# Patient Record
Sex: Male | Born: 1985 | Race: Black or African American | Hispanic: No | Marital: Single | Smoking: Never smoker
Health system: Southern US, Community
[De-identification: ages and names within clinical notes are randomized; demographics above are authoritative.]

---
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
## 2007-07-04 ENCOUNTER — Emergency Department (HOSPITAL_COMMUNITY): Admission: EM | Admit: 2007-07-04 | Discharge: 2007-07-04 | Payer: Self-pay | Admitting: Family Medicine

## 2008-08-27 ENCOUNTER — Emergency Department (HOSPITAL_COMMUNITY): Admission: EM | Admit: 2008-08-27 | Discharge: 2008-08-27 | Payer: Self-pay | Admitting: Family Medicine

## 2009-10-11 ENCOUNTER — Emergency Department (HOSPITAL_COMMUNITY): Admission: EM | Admit: 2009-10-11 | Discharge: 2009-10-11 | Payer: Self-pay | Admitting: Emergency Medicine

## 2010-11-18 ENCOUNTER — Emergency Department (HOSPITAL_COMMUNITY)
Admission: EM | Admit: 2010-11-18 | Discharge: 2010-11-18 | Disposition: A | Discharge status: Home / Self Care | Payer: Self-pay | Attending: Emergency Medicine | Admitting: Emergency Medicine

## 2010-11-18 DIAGNOSIS — L0231 Cutaneous abscess of buttock: Secondary | ICD-10-CM | POA: Insufficient documentation

## 2010-11-18 DIAGNOSIS — L03317 Cellulitis of buttock: Secondary | ICD-10-CM | POA: Insufficient documentation
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
## 2014-10-28 ENCOUNTER — Encounter (HOSPITAL_COMMUNITY): Payer: Self-pay | Admitting: Emergency Medicine

## 2014-10-28 ENCOUNTER — Emergency Department (INDEPENDENT_AMBULATORY_CARE_PROVIDER_SITE_OTHER)
Admission: EM | Admit: 2014-10-28 | Discharge: 2014-10-28 | Disposition: A | Discharge status: Home / Self Care | Payer: Self-pay | Source: Home / Self Care | Attending: Family Medicine | Admitting: Family Medicine

## 2014-10-28 DIAGNOSIS — M62838 Other muscle spasm: Secondary | ICD-10-CM

## 2014-10-28 MED ORDER — DICLOFENAC SODIUM 75 MG PO TBEC: 75.0000 mg | DELAYED_RELEASE_TABLET | Freq: Two times a day (BID) | ORAL | Status: DC

## 2014-10-28 MED ORDER — METHOCARBAMOL 500 MG PO TABS: 500.0000 mg | ORAL_TABLET | Freq: Four times a day (QID) | ORAL | Status: DC | PRN
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
## 2017-04-02 ENCOUNTER — Encounter: Payer: Self-pay | Admitting: Urgent Care

## 2017-04-02 ENCOUNTER — Ambulatory Visit (INDEPENDENT_AMBULATORY_CARE_PROVIDER_SITE_OTHER): Payer: Self-pay | Admitting: Urgent Care

## 2017-04-02 VITALS — BP 124/72 | HR 61 | Temp 98.5°F | Resp 16 | Ht 70.0 in | Wt 173.0 lb

## 2017-04-02 DIAGNOSIS — M542 Cervicalgia: Secondary | ICD-10-CM

## 2017-04-02 DIAGNOSIS — M62838 Other muscle spasm: Secondary | ICD-10-CM

## 2017-04-02 DIAGNOSIS — M79645 Pain in left finger(s): Secondary | ICD-10-CM

## 2017-04-02 DIAGNOSIS — L03012 Cellulitis of left finger: Secondary | ICD-10-CM

## 2017-04-02 MED ORDER — CYCLOBENZAPRINE HCL 5 MG PO TABS: 5.0000 mg | ORAL_TABLET | Freq: Three times a day (TID) | ORAL | 1 refills | Status: DC | PRN

## 2017-04-02 MED ORDER — CEPHALEXIN 500 MG PO CAPS: 500.0000 mg | ORAL_CAPSULE | Freq: Three times a day (TID) | ORAL | 0 refills | Status: AC

## 2017-04-02 NOTE — Progress Notes (Signed)
  MRN: [id number] DOB: 1985-11-11  Subjective:   Christopher Schwartz is a 31 y.o. male presenting for chief complaint of finger problem (left thumb; possible infection underneath the nail bed; painful)  Reports 1 week history of left thumb pain. Has progressed to redness, swelling. Has tried to keep it covered with a band-aid. Denies fever, red streaks on his hand or forearm. Reports intermittent neck pain over trapezius. Denies falls, radicular pain.   Christopher Schwartz is not currently taking any medications.  Also has No Known Allergies.  Christopher Schwartz denies past medical and surgical history.   Objective:   Vitals: BP 124/72   Pulse 61   Temp 98.5 F (36.9 C) (Oral)   Resp 16   Ht  (1.778 m)   Wt 173 lb (78.5 kg)   SpO2 96%   BMI 24.82 kg/m   Physical Exam  Constitutional: Christopher Schwartz is oriented to person, place, and time. Christopher Schwartz appears well-developed and well-nourished.  Cardiovascular: Normal rate.   Pulmonary/Chest: Effort normal.  Musculoskeletal:       Cervical back: Christopher Schwartz exhibits tenderness (over area depicted) and spasm (over area depicted).       Back:       Left hand: Christopher Schwartz exhibits decreased range of motion (left thumb flexion), tenderness (over paronychia) and swelling (left thumb). Christopher Schwartz exhibits normal capillary refill and no laceration. Normal sensation noted. Normal strength noted.       Hands: Neurological: Christopher Schwartz is alert and oriented to person, place, and time.   PROCEDURE NOTE: I&D of Abscess Verbal consent obtained. Local anesthesia with 1cc of 2% lidocaine without epinephrine. Site cleansed with Betadine. Incision of 1cm was made using a 11 blade, discharge of ~1cc of pus and serosanguinous fluid. Cleansed and dressed.  Assessment and Plan :   Paronychia of left thumb - Plan: WOUND CULTURE  Pain of left thumb  Trapezius muscle spasm  Neck pain  Paronychia treated with I&D, wound culture pending, start Keflex. Counseled on posture, hydration and conservative management of  muscles spasms of trapezius.   Wallis Bamberg, PA-C [facility] [phone] 04/02/2017  5:32 PM

## 2017-04-02 NOTE — Patient Instructions (Addendum)
You may take  Tylenol with ibuprofen 400-600mg  every 6 hours for pain and inflammation.     Paronychia Paronychia is an infection of the skin that surrounds a nail. It usually affects the skin around a fingernail, but it may also occur near a toenail. It often causes pain and swelling around the nail. This condition may come on suddenly or develop over a longer period. In some cases, a collection of pus (abscess) can form near or under the nail. Usually, paronychia is not serious and it clears up with treatment. What are the causes? This condition may be caused by bacteria or fungi. It is commonly caused by either Streptococcus or Staphylococcus bacteria. The bacteria or fungi often cause the infection by getting into the affected area through an opening in the skin, such as a cut or a hangnail. What increases the risk? This condition is more likely to develop in:  People who get their hands wet often, such as those who work as Fish farm manager, bartenders, or nurses.  People who bite their fingernails or suck their thumbs.  People who trim their nails too short.  People who have hangnails or injured fingertips.  People who get manicures.  People who have diabetes.  What are the signs or symptoms? Symptoms of this condition include:  Redness and swelling of the skin near the nail.  Tenderness around the nail when you touch the area.  Pus-filled bumps under the cuticle. The cuticle is the skin at the base or sides of the nail.  Fluid or pus under the nail.  Throbbing pain in the area.  How is this diagnosed? This condition is usually diagnosed with a physical exam. In some cases, a sample of pus may be taken from an abscess to be tested in a lab. This can help to determine what type of bacteria or fungi is causing the condition. How is this treated? Treatment for this condition depends on the cause and severity of the condition. If the condition is mild, it may clear up on its  own in a few days. Your health care provider may recommend soaking the affected area in warm water a few times a day. When treatment is needed, the options may include:  Antibiotic medicine, if the condition is caused by a bacterial infection.  Antifungal medicine, if the condition is caused by a fungal infection.  Incision and drainage, if an abscess is present. In this procedure, the health care provider will cut open the abscess so the pus can drain out.  Follow these instructions at home:  Soak the affected area in warm water if directed to do so by your health care provider. You may be told to do this for 20 minutes, 2-3 times a day. Keep the area dry in between soakings.  Take medicines only as directed by your health care provider.  If you were prescribed an antibiotic medicine, finish all of it even if you start to feel better.  Keep the affected area clean.  Do not try to drain a fluid-filled bump yourself.  If you will be washing dishes or performing other tasks that require your hands to get wet, wear rubber gloves. You should also wear gloves if your hands might come in contact with irritating substances, such as cleaners or chemicals.  Follow your health care provider's instructions about: ? Wound care. ? Bandage (dressing) changes and removal. Contact a health care provider if:  Your symptoms get worse or do not improve with treatment.  You have a fever or chills.  You have redness spreading from the affected area.  You have continued or increased fluid, blood, or pus coming from the affected area.  Your finger or knuckle becomes swollen or is difficult to move. This information is not intended to replace advice given to you by your health care provider. Make sure you discuss any questions you have with your health care provider. Document Released: 12/05/2000 Document Revised: 11/17/2015 Document Reviewed: 05/19/2014 Elsevier Interactive Patient Education  2018  [employer].     Muscle Cramps and Spasms Muscle cramps and spasms occur when a muscle or muscles tighten and you have no control over this tightening (involuntary muscle contraction). They are a common problem and can develop in any muscle. The most common place is in the calf muscles of the leg. Muscle cramps and muscle spasms are both involuntary muscle contractions, but there are some differences between the two:  Muscle cramps are painful. They come and go and may last a few seconds to 15 minutes. Muscle cramps are often more forceful and last longer than muscle spasms.  Muscle spasms may or may not be painful. They may also last just a few seconds or much longer.  Certain medical conditions, such as diabetes or Parkinson disease, can make it more likely to develop cramps or spasms. However, cramps or spasms are usually not caused by a serious underlying problem. Common causes include:  Overexertion.  Overuse from repetitive motions, or doing the same thing over and over.  Remaining in a certain position for a long period of time.  Improper preparation, form, or technique while playing a sport or doing an activity.  Dehydration.  Injury.  Side effects of some medicines.  Abnormally low levels of the salts and ions in your blood (electrolytes), especially potassium and calcium. This could happen if you are taking water pills (diuretics) or if you are pregnant.  In many cases, the cause of muscle cramps or spasms is unknown. Follow these instructions at home:  Stay well hydrated. Drink enough fluid to keep your urine clear or pale yellow.  Try massaging, stretching, and relaxing the affected muscle.  If directed, apply heat to tight or tense muscles as often as told by your health care provider. Use the heat source that your health care provider recommends, such as a moist heat pack or a heating pad. ? Place a towel between your skin and the heat source. ? Leave the heat on  for 20-30 minutes. ? Remove the heat if your skin turns bright red. This is especially important if you are unable to feel pain, heat, or cold. You may have a greater risk of getting burned.  If directed, put ice on the affected area. This may help if you are sore or have pain after a cramp or spasm. ? Put ice in a plastic bag. ? Place a towel between your skin and the bag. ? Leavethe ice on for 20 minutes, 2-3 times a day.  Take over-the-counter and prescription medicines only as told by your health care provider.  Pay attention to any changes in your symptoms. Contact a health care provider if:  Your cramps or spasms get more severe or happen more often.  Your cramps or spasms do not improve over time. This information is not intended to replace advice given to you by your health care provider. Make sure you discuss any questions you have with your health care provider. Document Released: 12/01/2001 Document Revised:  07/13/2015 Document Reviewed: 03/15/2015 Elsevier Interactive Patient Education  2018 [employer].     IF you received an x-ray today, you will receive an invoice from [facility]. Please contact [facility] at [phone] with questions or concerns regarding your invoice.   IF you received labwork today, you will receive an invoice from [facility]. Please contact [facility] at [phone] with questions or concerns regarding your invoice.   Our billing staff will not be able to assist you with questions regarding bills from these companies.  You will be contacted with the lab results as soon as they are available. The fastest way to get your results is to activate your My Chart account. Instructions are located on the last page of this paperwork. If you have not heard from [place] regarding the results in 2 weeks, please contact this office.    '

## 2017-04-04 LAB — WOUND CULTURE: Organism ID, Bacteria: NONE SEEN

## 2017-04-09 ENCOUNTER — Ambulatory Visit (INDEPENDENT_AMBULATORY_CARE_PROVIDER_SITE_OTHER): Payer: Self-pay | Admitting: Family Medicine

## 2017-04-09 ENCOUNTER — Encounter: Payer: Self-pay | Admitting: Family Medicine

## 2017-04-09 VITALS — BP 118/70 | HR 72 | Temp 98.5°F | Resp 16 | Ht 70.0 in | Wt 172.6 lb

## 2017-04-09 DIAGNOSIS — L03012 Cellulitis of left finger: Secondary | ICD-10-CM

## 2017-04-09 DIAGNOSIS — M62838 Other muscle spasm: Secondary | ICD-10-CM

## 2017-04-09 NOTE — Patient Instructions (Addendum)
Thumb looks much better. If any return of swelling redness or discharge, return for recheck  I suspect you have some trapezius and paraspinal neck muscle spasm. Work on range of motion and stretches as we discussed, massage as needed, change head positions throughout the day when you're working to minimize some spasm. See other information below. If that is not improving in the next 3-4 weeks, return for possible neck x-rays. Sooner if worse.    Muscle Cramps and Spasms Muscle cramps and spasms occur when a muscle or muscles tighten and you have no control over this tightening (involuntary muscle contraction). They are a common problem and can develop in any muscle. The most common place is in the calf muscles of the leg. Muscle cramps and muscle spasms are both involuntary muscle contractions, but there are some differences between the two:  Muscle cramps are painful. They come and go and may last a few seconds to 15 minutes. Muscle cramps are often more forceful and last longer than muscle spasms.  Muscle spasms may or may not be painful. They may also last just a few seconds or much longer.  Certain medical conditions, such as diabetes or Parkinson disease, can make it more likely to develop cramps or spasms. However, cramps or spasms are usually not caused by a serious underlying problem. Common causes include:  Overexertion.  Overuse from repetitive motions, or doing the same thing over and over.  Remaining in a certain position for a long period of time.  Improper preparation, form, or technique while playing a sport or doing an activity.  Dehydration.  Injury.  Side effects of some medicines.  Abnormally low levels of the salts and ions in your blood (electrolytes), especially potassium and calcium. This could happen if you are taking water pills (diuretics) or if you are pregnant.  In many cases, the cause of muscle cramps or spasms is unknown. Follow these instructions at  home:  Stay well hydrated. Drink enough fluid to keep your urine clear or pale yellow.  Try massaging, stretching, and relaxing the affected muscle.  If directed, apply heat to tight or tense muscles as often as told by your health care provider. Use the heat source that your health care provider recommends, such as a moist heat pack or a heating pad. ? Place a towel between your skin and the heat source. ? Leave the heat on for 20-30 minutes. ? Remove the heat if your skin turns bright red. This is especially important if you are unable to feel pain, heat, or cold. You may have a greater risk of getting burned.  If directed, put ice on the affected area. This may help if you are sore or have pain after a cramp or spasm. ? Put ice in a plastic bag. ? Place a towel between your skin and the bag. ? Leavethe ice on for 20 minutes, 2-3 times a day.  Take over-the-counter and prescription medicines only as told by your health care provider.  Pay attention to any changes in your symptoms. Contact a health care provider if:  Your cramps or spasms get more severe or happen more often.  Your cramps or spasms do not improve over time. This information is not intended to replace advice given to you by your health care provider. Make sure you discuss any questions you have with your health care provider. Document Released: 12/01/2001 Document Revised: 07/13/2015 Document Reviewed: 03/15/2015 Elsevier Interactive Patient Education  2018 [employer].  IF you received an x-ray today, you will receive an invoice from [facility]. Please contact [facility] at [phone] with questions or concerns regarding your invoice.   IF you received labwork today, you will receive an invoice from [place]. Please contact [facility] at [phone] with questions or concerns regarding your invoice.   Our billing staff will not be able to assist you with questions regarding bills from  these companies.  You will be contacted with the lab results as soon as they are available. The fastest way to get your results is to activate your My Chart account. Instructions are located on the last page of this paperwork. If you have not heard from [place] regarding the results in 2 weeks, please contact this office.

## 2017-04-09 NOTE — Progress Notes (Signed)
Subjective:  By signing my name below, I, Essence Howell, attest that this documentation has been prepared under the direction and in the presence of Shade Flood, MD Electronically Signed: Charline Bills, ED Scribe 04/09/2017 at 5:56 PM.   Patient ID: Christopher Schwartz, male    DOB: 22-Sep-1985, 31 y.o.   MRN: [id number]  Chief Complaint  Patient presents with  . Nail Problem    left thumb nail problem follow up  . Follow-up    no issues   HPI Christopher Schwartz is a 31 y.o. male who presents to Primary Care at [facility] for follow-up of paronychia. Seen 10/9 for L thumb pain x 1 week. Redness and swelling. I&D performed with exudate expressed. Started of Keflex. Culture coag negative staph. Also having R sided trapezius pain. Discussed posture, hydration and conservative management.   Pt states that he missed a dose but will finish Keflex tomorrow. Denies return of redness, swelling, pain, drainage from the area.   R Trapezius Pain Pt states that he constantly has trapezius pain that starts in his neck. He describes pain as a burning sensation that is exacerbated with sitting upright for a 3+ hours. Denies weakness in upper extremities.   There are no active problems to display for this patient.  No past medical history on file. No past surgical history on file. No Known Allergies Prior to Admission medications   Medication Sig Start Date End Date Taking? Authorizing Provider  cephALEXin (KEFLEX) 500 MG capsule Take 1 capsule (500 mg total) by mouth 3 (three) times daily. 04/02/17   Wallis Bamberg, PA-C  cyclobenzaprine (FLEXERIL) 5 MG tablet Take 1 tablet (5 mg total) by mouth 3 (three) times daily as needed. 04/02/17   Wallis Bamberg, PA-C   Social History   Social History  . Marital status: Single    Spouse name: N/A  . Number of children: N/A  . Years of education: N/A   Occupational History  . Not on file.   Social History Main Topics  . Smoking status: Never Smoker  .  Smokeless tobacco: Never Used  . Alcohol use Yes  . Drug use: Unknown  . Sexual activity: Not on file   Other Topics Concern  . Not on file   Social History Narrative  . No narrative on file   Review of Systems  Musculoskeletal: Positive for myalgias.  Skin: Positive for wound. Negative for color change.  Neurological: Negative for weakness.      Objective:   Physical Exam  Constitutional: He is oriented to person, place, and time. He appears well-developed and well-nourished. No distress.  HENT:  Head: Normocephalic and atraumatic.  Eyes: Conjunctivae and EOM are normal.  Neck: Neck supple. No tracheal deviation present.  Cardiovascular: Normal rate.   Pulmonary/Chest: Effort normal. No respiratory distress.  Musculoskeletal: Normal range of motion.  Slight trapezius spasms R greater than L. No midline bony tenderness. Equal scapula thoracic motion. No winging. Strength equal in biceps, triceps, deltoids.  Neurological: He is alert and oriented to person, place, and time.  Reflex Scores:      Tricep reflexes are 2+ on the right side and 2+ on the left side.      Bicep reflexes are 2+ on the right side and 2+ on the left side.      Brachioradialis reflexes are 2+ on the right side and 2+ on the left side. Skin: Skin is warm and dry.  Healing wound on L ulnar nailfold. No  appreciable swelling or exudate. No fluctuance. Minimal decrease IP flexion at the thumb compared to R but pain free. Intact flexion and extension against resistance.   Psychiatric: He has a normal mood and affect. His behavior is normal.  Nursing note and vitals reviewed.  Vitals:   04/09/17 1656  BP: 118/70  Pulse: 72  Resp: 16  Temp: 98.5 F (36.9 C)  TempSrc: Oral  SpO2: 99%  Weight: 172 lb 9.6 oz (78.3 kg)  Height:  (1.778 m)      Assessment & Plan:    Christopher Schwartz is a 31 y.o. male Paronychia of left thumb  - improved. rtc precautions if recurrence or increased pain/swelling.    Trapezius muscle spasm  - Suspect a component of trapezius spasm, paraspinal neck spasm. Discussed range of motion, stretches throughout the day, frequent position changes. If not improving in 3-4 weeks, consider cervical source with evaluation by x-ray. Sooner if worse  No orders of the defined types were placed in this encounter.  Patient Instructions    Thumb looks much better. If any return of swelling redness or discharge, return for recheck  I suspect you have some trapezius and paraspinal neck muscle spasm. Work on range of motion and stretches as we discussed, massage as needed, change head positions throughout the day when you're working to minimize some spasm. See other information below. If that is not improving in the next 3-4 weeks, return for possible neck x-rays. Sooner if worse.    Muscle Cramps and Spasms Muscle cramps and spasms occur when a muscle or muscles tighten and you have no control over this tightening (involuntary muscle contraction). They are a common problem and can develop in any muscle. The most common place is in the calf muscles of the leg. Muscle cramps and muscle spasms are both involuntary muscle contractions, but there are some differences between the two:  Muscle cramps are painful. They come and go and may last a few seconds to 15 minutes. Muscle cramps are often more forceful and last longer than muscle spasms.  Muscle spasms may or may not be painful. They may also last just a few seconds or much longer.  Certain medical conditions, such as diabetes or Parkinson disease, can make it more likely to develop cramps or spasms. However, cramps or spasms are usually not caused by a serious underlying problem. Common causes include:  Overexertion.  Overuse from repetitive motions, or doing the same thing over and over.  Remaining in a certain position for a long period of time.  Improper preparation, form, or technique while playing a sport or doing  an activity.  Dehydration.  Injury.  Side effects of some medicines.  Abnormally low levels of the salts and ions in your blood (electrolytes), especially potassium and calcium. This could happen if you are taking water pills (diuretics) or if you are pregnant.  In many cases, the cause of muscle cramps or spasms is unknown. Follow these instructions at home:  Stay well hydrated. Drink enough fluid to keep your urine clear or pale yellow.  Try massaging, stretching, and relaxing the affected muscle.  If directed, apply heat to tight or tense muscles as often as told by your health care provider. Use the heat source that your health care provider recommends, such as a moist heat pack or a heating pad. ? Place a towel between your skin and the heat source. ? Leave the heat on for 20-30 minutes. ? Remove the heat  if your skin turns bright red. This is especially important if you are unable to feel pain, heat, or cold. You may have a greater risk of getting burned.  If directed, put ice on the affected area. This may help if you are sore or have pain after a cramp or spasm. ? Put ice in a plastic bag. ? Place a towel between your skin and the bag. ? Leavethe ice on for 20 minutes, 2-3 times a day.  Take over-the-counter and prescription medicines only as told by your health care provider.  Pay attention to any changes in your symptoms. Contact a health care provider if:  Your cramps or spasms get more severe or happen more often.  Your cramps or spasms do not improve over time. This information is not intended to replace advice given to you by your health care provider. Make sure you discuss any questions you have with your health care provider. Document Released: 12/01/2001 Document Revised: 07/13/2015 Document Reviewed: 03/15/2015 Elsevier Interactive Patient Education  2018 [employer].    IF you received an x-ray today, you will receive an invoice from [facility]  Radiology. Please contact [facility] at [phone] with questions or concerns regarding your invoice.   IF you received labwork today, you will receive an invoice from [place]. Please contact [facility] at [phone] with questions or concerns regarding your invoice.   Our billing staff will not be able to assist you with questions regarding bills from these companies.  You will be contacted with the lab results as soon as they are available. The fastest way to get your results is to activate your My Chart account. Instructions are located on the last page of this paperwork. If you have not heard from [place] regarding the results in 2 weeks, please contact this office.       I personally performed the services described in this documentation, which was scribed in my presence. The recorded information has been reviewed and considered for accuracy and completeness, addended by me as needed, and agree with information above.  Signed,   Meredith Staggers, MD [facility] at [facility].  04/13/17 10:00 AM

## 2020-07-06 ENCOUNTER — Encounter: Payer: Self-pay | Admitting: Emergency Medicine

## 2020-07-06 ENCOUNTER — Ambulatory Visit
Admission: EM | Admit: 2020-07-06 | Discharge: 2020-07-06 | Disposition: A | Discharge status: Home / Self Care | Payer: Self-pay | Attending: Family Medicine | Admitting: Family Medicine

## 2020-07-06 ENCOUNTER — Other Ambulatory Visit: Payer: Self-pay

## 2020-07-06 ENCOUNTER — Ambulatory Visit (INDEPENDENT_AMBULATORY_CARE_PROVIDER_SITE_OTHER): Payer: Self-pay

## 2020-07-06 DIAGNOSIS — M79662 Pain in left lower leg: Secondary | ICD-10-CM

## 2020-07-06 DIAGNOSIS — B351 Tinea unguium: Secondary | ICD-10-CM

## 2020-07-06 DIAGNOSIS — R2241 Localized swelling, mass and lump, right lower limb: Secondary | ICD-10-CM

## 2020-07-06 MED ORDER — MELOXICAM 10 MG PO CAPS: 10.0000 mg | ORAL_CAPSULE | Freq: Every day | ORAL | 0 refills | Status: AC

## 2020-07-06 MED ORDER — CLOTRIMAZOLE 1 % EX CREA: TOPICAL_CREAM | CUTANEOUS | 0 refills | Status: AC

## 2020-07-06 NOTE — ED Provider Notes (Signed)
[facility]   [id number] 07/06/20 Arrival Time: 1754  ZJ:IRCVE PAIN  SUBJECTIVE: History from: patient. Christopher Schwartz is a 35 y.o. male complains of right shin pain that began couple weeks ago.  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  Reports that he hits his leg often.  Does not recall specific injury.  Localizes pain to the right anterior lower leg.  Also complains of fungus to toenails on the left foot.  Has been using peroxide to help clean his feet.  This is not helped. Symptoms are made worse with activity.  Denies similar symptoms in the past.  Denies fever, chills, erythema, ecchymosis, effusion, weakness, numbness and tingling, saddle paresthesias, loss of bowel or bladder function.      ROS: As per HPI.  All other pertinent ROS negative.     History reviewed. No pertinent past medical history. History reviewed. No pertinent surgical history. No Known Allergies No current facility-administered medications on file prior to encounter.   Current Outpatient Medications on File Prior to Encounter  Medication Sig Dispense Refill   cephALEXin (KEFLEX) 500 MG capsule Take 1 capsule (500 mg total) by mouth 3 (three) times daily. 21 capsule 0   Social History   Socioeconomic History   Marital status: Not on file    Spouse name: Not on file   Number of children: Not on file   Years of education: Not on file   Highest education level: Not on file  Occupational History   Not on file  Tobacco Use   Smoking status: Never Smoker   Smokeless tobacco: Never Used  Substance and Sexual Activity   Alcohol use: Yes   Drug use: Yes    Types: Marijuana    Comment: daily   Sexual activity: Not on file  Other Topics Concern   Not on file  Social History Narrative   ** Merged History Encounter **       Social Determinants of Health   Financial Resource Strain: Not on file  Food Insecurity: Not on file  Transportation Needs: Not on file   Physical Activity: Not on file  Stress: Not on file  Social Connections: Not on file  Intimate Partner Violence: Not on file   Family History  Problem Relation Age of Onset   Diabetes Sister     OBJECTIVE:  Vitals:   07/06/20 1819 07/06/20 1821  BP:  109/63  Pulse:  67  Resp:  18  Temp:  98.1 F (36.7 C)  TempSrc:  Oral  SpO2:  94%  Weight: 175 lb (79.4 kg)   Height: 5\' 10"  (1.778 m)     General appearance: ALERT; in no acute distress.  Head: NCAT Lungs: Normal respiratory effort CV:  pulses 2+ bilaterally. Cap refill < 2 seconds Musculoskeletal:  Inspection: Skin warm, dry, clear and intact without obvious erythema, effusion, or ecchymosis.  Palpation: Nontender to palpation ROM: FROM active and passive Skin: warm and dry, tips of the toenails on the left foot are dark yellow and green in color Neurologic: Ambulates without difficulty; Sensation intact about the upper/ lower extremities Psychological: alert and cooperative; normal mood and affect  DIAGNOSTIC STUDIES:  No results found.   ASSESSMENT & PLAN:  1. Pain in left lower leg   2. Nail fungus     Meds ordered this encounter  Medications   clotrimazole (LOTRIMIN) 1 % cream    Sig: Apply to affected area 2 times daily    Dispense:  15 g    Refill:  0    Order Specific Question:   Supervising Provider    Answer:   Merrilee Jansky [id number]   Meloxicam 10 MG CAPS    Sig: Take 10 mg by mouth daily.    Dispense:  30 capsule    Refill:  0    Order Specific Question:   Supervising Provider    Answer:   Merrilee Jansky [[id number]]    X-ray negative today I have sent in meloxicam for you to take daily for pain and inflammation Do not take ibuprofen with this medication I have sent in Lotrimin apply to the affected areas on your feet twice a day Continue conservative management of rest, ice, and gentle stretches Take Tylenol as needed for pain relief   Follow up with if symptoms persist Return  or go to the ER if you have any new or worsening symptoms (fever, chills, chest pain, abdominal pain, changes in bowel or bladder habits, pain radiating into lower legs)  Reviewed expectations re: course of current medical issues. Questions answered. Outlined signs and symptoms indicating need for more acute intervention. Patient verbalized understanding. After Visit Summary given.       Moshe Cipro, NP 07/08/20 1327

## 2020-07-06 NOTE — Discharge Instructions (Addendum)
Xrays are negative today  Take meloxicam once daily for the pain in your right leg.  Do not take ibuprofen with this medication  Use Lotrimin to the bottom of your feet and to the toenails on the left foot.  Follow-up with orthopedics in 2 weeks if you are still experiencing leg pain  Follow up with this office or with primary care if symptoms are persisting.  Follow up in the ER for high fever, trouble swallowing, trouble breathing, other concerning symptoms.

## 2020-07-06 NOTE — ED Triage Notes (Signed)
RT shin pain on and off for few months. Denies any injury.  LT great toe has greenish color on nail.

## 2020-07-07 ENCOUNTER — Ambulatory Visit: Payer: Self-pay

## 2020-07-07 ENCOUNTER — Encounter: Payer: Self-pay | Admitting: Emergency Medicine

## 2022-01-08 IMAGING — DX DG TIBIA/FIBULA 2V*R*
2 series · 2 of 2 positions shown · non-contrast
Comparison: None.

CLINICAL DATA: Swelling, injury

EXAM:
RIGHT TIBIA AND FIBULA - 2 VIEW

[tib/fib ap]
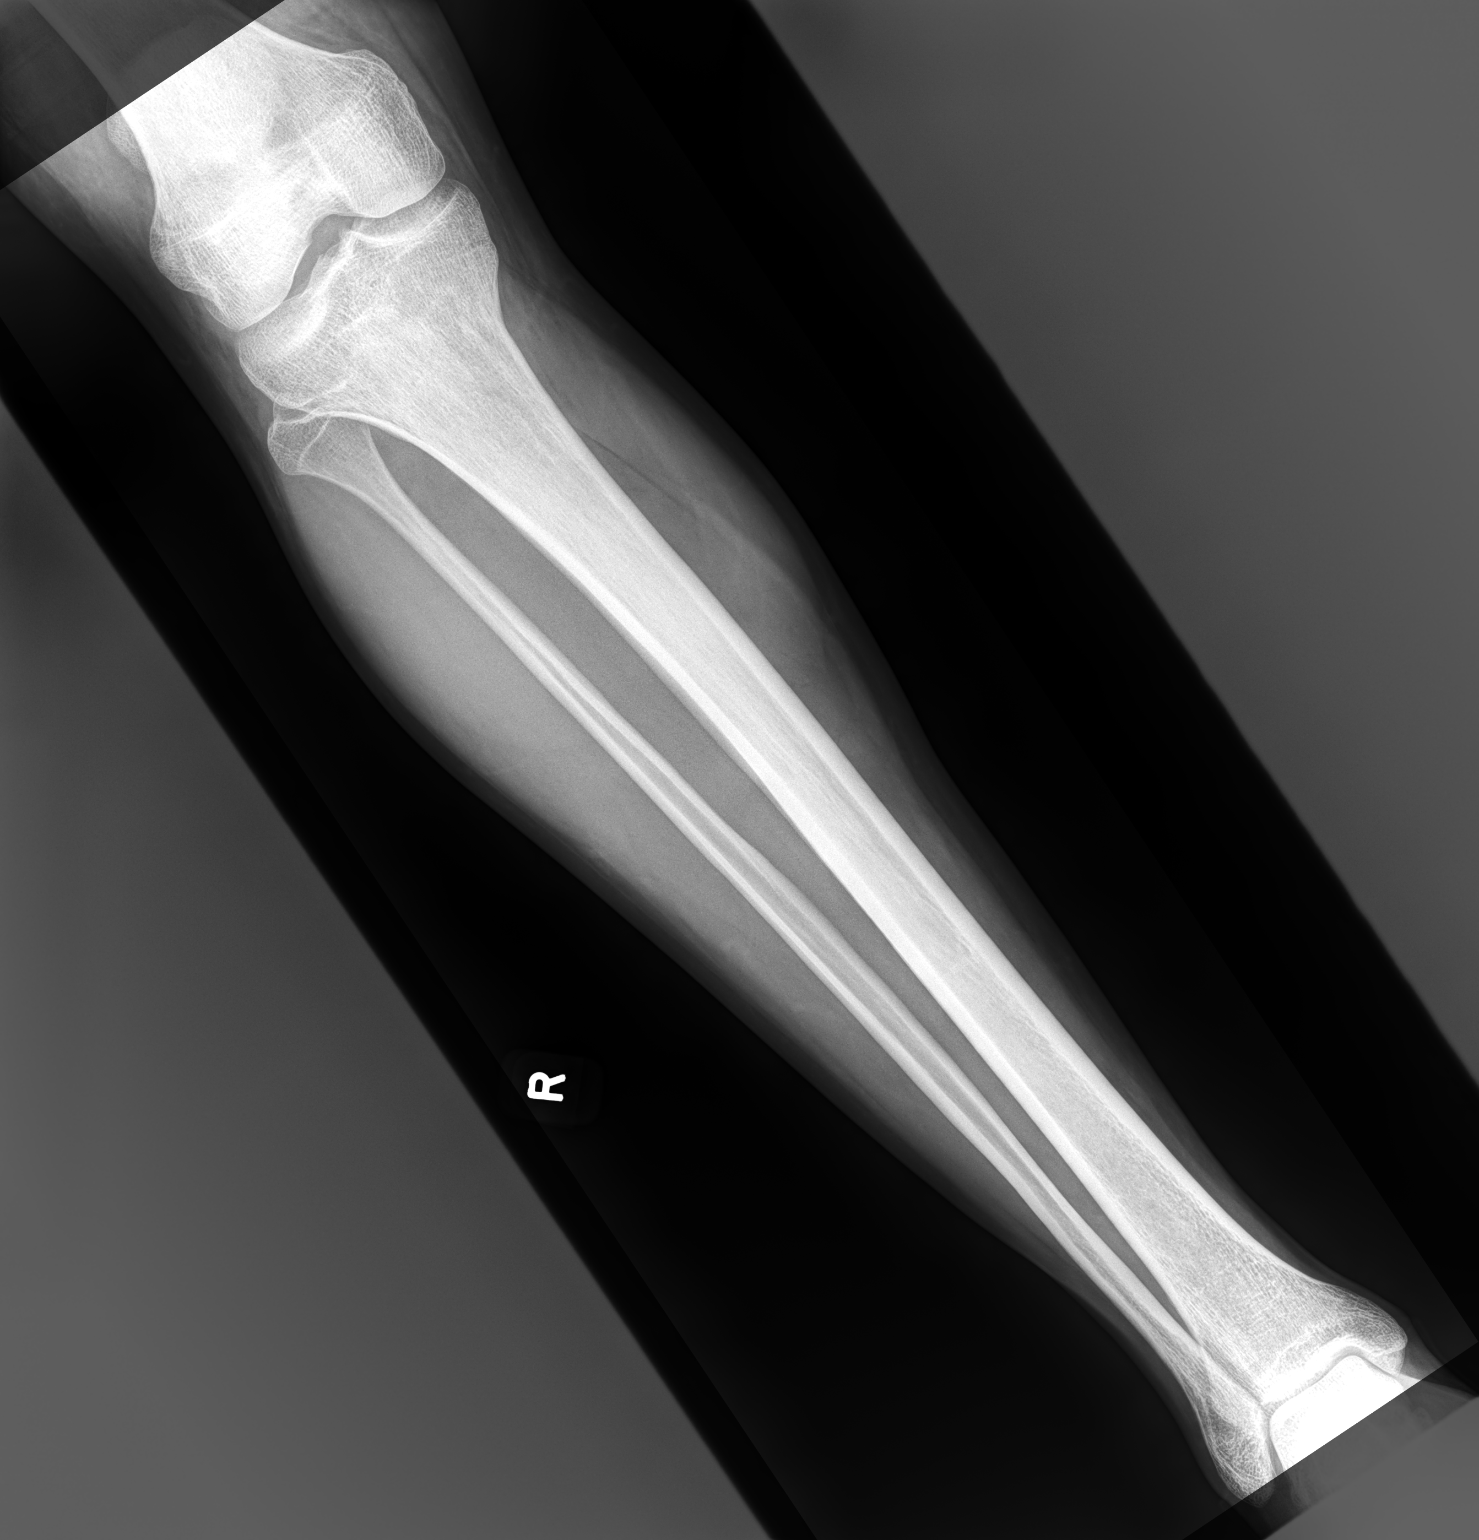

[tib/fib lat]
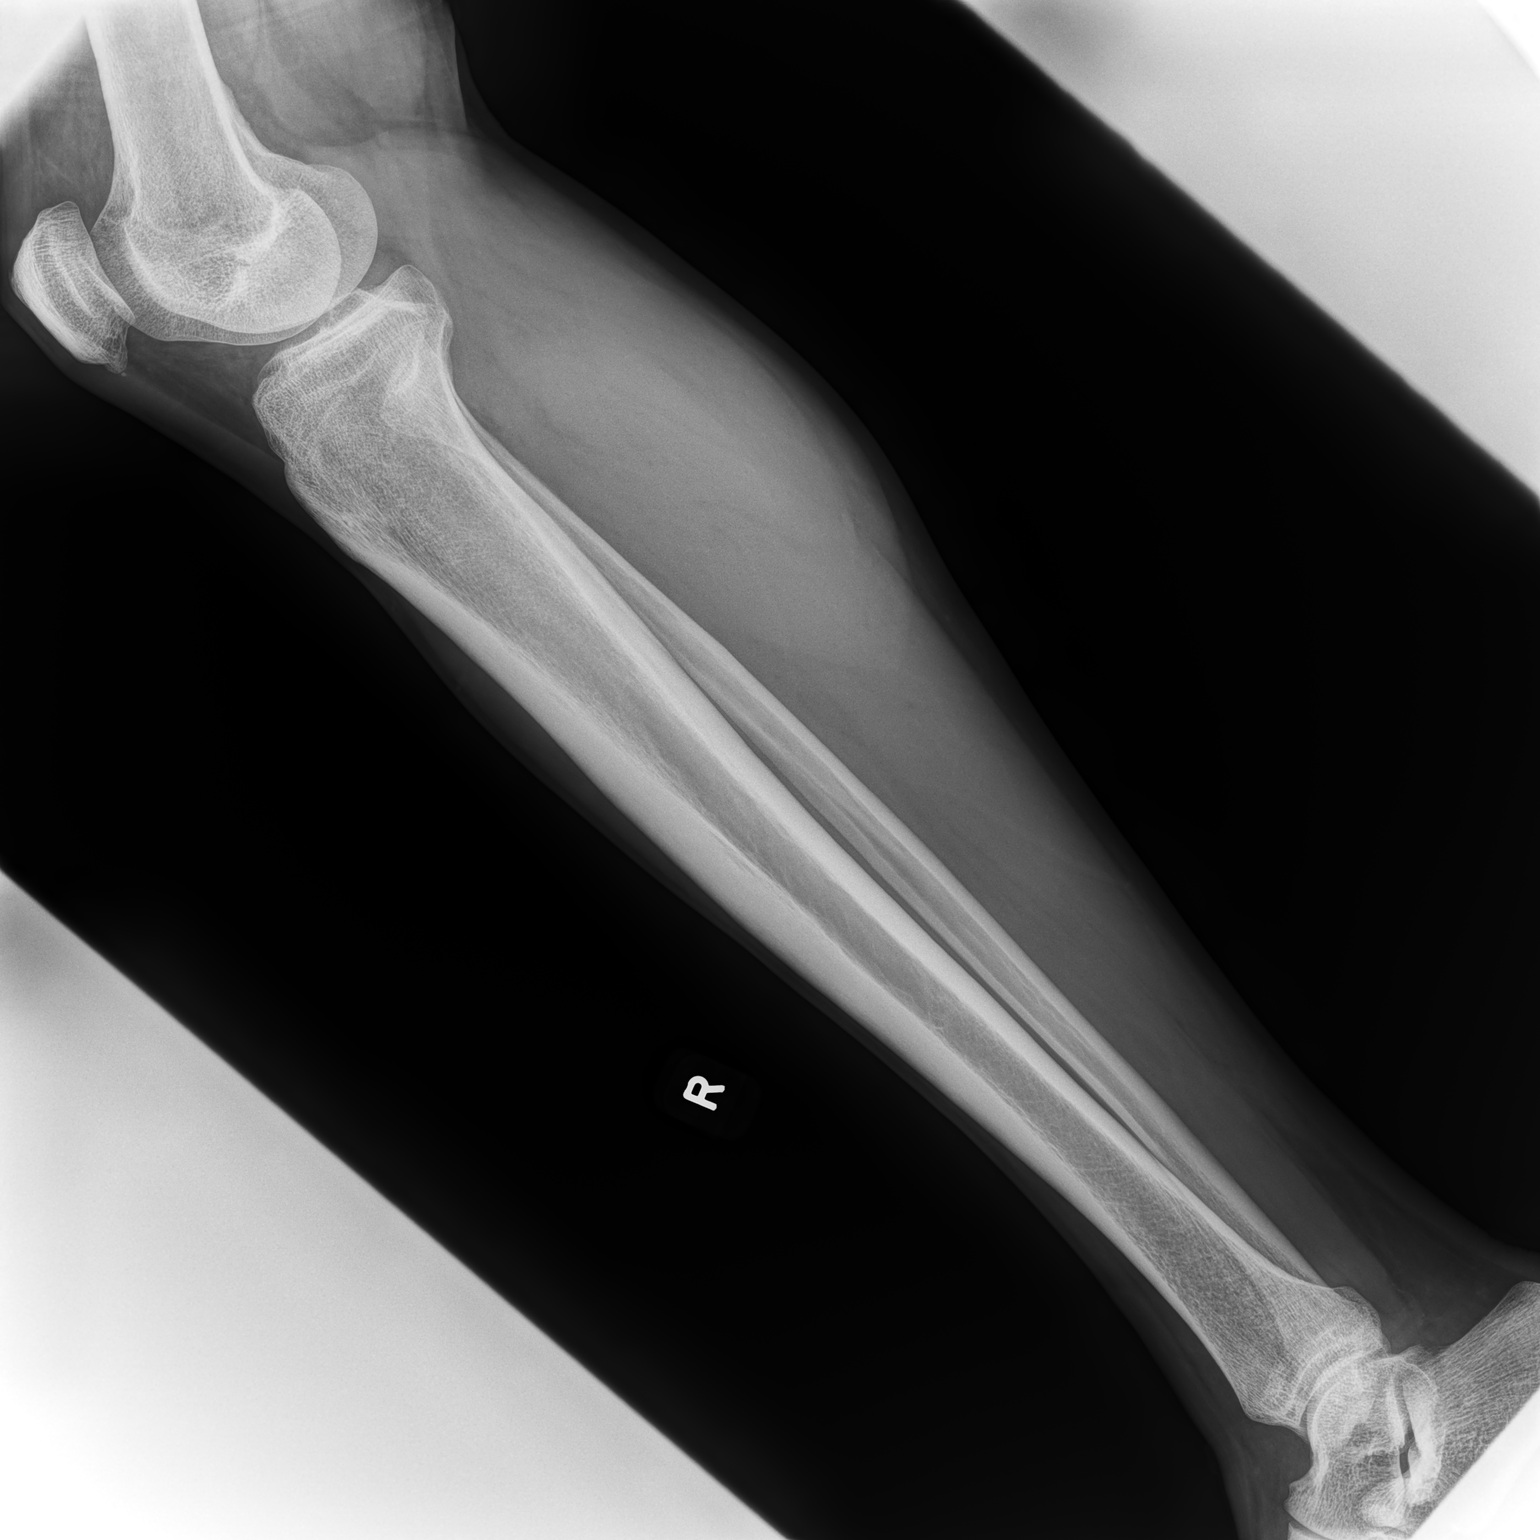

[2 of 2 positions shown; findings below may reference images not displayed]

FINDINGS: There is no evidence of fracture or other focal bone lesions. Soft
tissues are unremarkable.
IMPRESSION: Negative.
